# Patient Record
Sex: Female | Born: 2000 | Race: White | Hispanic: No | Marital: Single | Smoking: Never smoker
Health system: Southern US, Community
[De-identification: ages and names within clinical notes are randomized; demographics above are authoritative.]

---
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
## 2020-03-27 ENCOUNTER — Emergency Department
Admission: EM | Admit: 2020-03-27 | Discharge: 2020-03-27 | Disposition: A | Discharge status: Home / Self Care | Payer: BC Managed Care – PPO | Attending: Emergency Medicine | Admitting: Emergency Medicine

## 2020-03-27 ENCOUNTER — Other Ambulatory Visit: Payer: Self-pay

## 2020-03-27 DIAGNOSIS — W228XXA Striking against or struck by other objects, initial encounter: Secondary | ICD-10-CM | POA: Diagnosis not present

## 2020-03-27 DIAGNOSIS — S060X0A Concussion without loss of consciousness, initial encounter: Secondary | ICD-10-CM | POA: Insufficient documentation

## 2020-03-27 DIAGNOSIS — S0990XA Unspecified injury of head, initial encounter: Secondary | ICD-10-CM | POA: Diagnosis present

## 2020-03-27 MED ORDER — PROCHLORPERAZINE MALEATE 10 MG PO TABS: 10.0000 mg | ORAL_TABLET | Freq: Four times a day (QID) | ORAL | 0 refills | Status: DC | PRN
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
## 2020-03-27 NOTE — ED Notes (Signed)
Pt verbalizes understanding of d/c instructions and follow up. D/C form printed, signed and sent to HIM to be scanned into the chart

## 2020-03-27 NOTE — ED Notes (Signed)
Spoke with Dr. Dolores Frame regarding patient, no orders at this time.
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
## 2020-04-01 ENCOUNTER — Emergency Department: Payer: BC Managed Care – PPO

## 2020-04-01 ENCOUNTER — Encounter: Payer: Self-pay | Admitting: Emergency Medicine

## 2020-04-01 ENCOUNTER — Emergency Department
Admission: EM | Admit: 2020-04-01 | Discharge: 2020-04-01 | Disposition: A | Discharge status: Home / Self Care | Payer: BC Managed Care – PPO | Attending: Emergency Medicine | Admitting: Emergency Medicine

## 2020-04-01 ENCOUNTER — Other Ambulatory Visit: Payer: Self-pay

## 2020-04-01 DIAGNOSIS — W2209XD Striking against other stationary object, subsequent encounter: Secondary | ICD-10-CM | POA: Insufficient documentation

## 2020-04-01 DIAGNOSIS — S0990XD Unspecified injury of head, subsequent encounter: Secondary | ICD-10-CM | POA: Diagnosis present

## 2020-04-01 DIAGNOSIS — S0990XA Unspecified injury of head, initial encounter: Secondary | ICD-10-CM

## 2020-04-01 MED ORDER — MECLIZINE HCL 50 MG PO TABS: 50.0000 mg | ORAL_TABLET | Freq: Three times a day (TID) | ORAL | 0 refills | Status: AC | PRN

## 2020-04-01 MED ORDER — KETOROLAC TROMETHAMINE 10 MG PO TABS: 10.0000 mg | ORAL_TABLET | Freq: Four times a day (QID) | ORAL | 0 refills | Status: AC | PRN

## 2020-04-01 MED ORDER — KETOROLAC TROMETHAMINE 30 MG/ML IJ SOLN
30.0000 mg | Freq: Once | INTRAMUSCULAR | Status: AC
  Administered 2020-04-01: 30 mg via INTRAMUSCULAR
  Filled 2020-04-01: qty 1

## 2020-04-01 NOTE — ED Notes (Signed)
Unable to obtain discharge signature as electronic pad in room is not working
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
## 2020-04-01 NOTE — Discharge Instructions (Addendum)
Please keep appointment with neurology on Tuesday. You can continue to take Toradol as needed for headache. You can take meclizine for dizziness. Please return with new or worsening symptoms.
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
## 2021-05-31 ENCOUNTER — Other Ambulatory Visit: Payer: Self-pay | Admitting: Otolaryngology

## 2021-05-31 DIAGNOSIS — J329 Chronic sinusitis, unspecified: Secondary | ICD-10-CM

## 2021-06-07 ENCOUNTER — Other Ambulatory Visit: Payer: BC Managed Care – PPO

## 2021-07-18 ENCOUNTER — Ambulatory Visit
Admission: RE | Admit: 2021-07-18 | Discharge: 2021-07-18 | Disposition: A | Discharge status: Home / Self Care | Payer: BC Managed Care – PPO | Source: Ambulatory Visit | Attending: Otolaryngology | Admitting: Otolaryngology

## 2021-07-18 DIAGNOSIS — J329 Chronic sinusitis, unspecified: Secondary | ICD-10-CM

## 2022-01-05 IMAGING — CT CT HEAD W/O CM
3 series · 15 of 44 positions shown, 18 images · non-contrast
Comparison: None.

CLINICAL DATA: Nausea and headache

EXAM:
CT HEAD WITHOUT CONTRAST
TECHNIQUE: Contiguous axial images were obtained from the base of the skull
through the vertex without intravenous contrast.

[Series 2: head wo · axial · 0.40mm/px · z∈[-90,+20]mm · 9 of 27 slices shown, 12 images]
[im 3/27  brain]
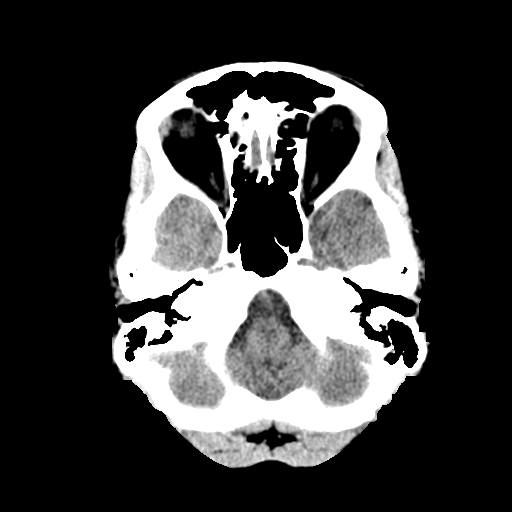
[im 3/27  bone]
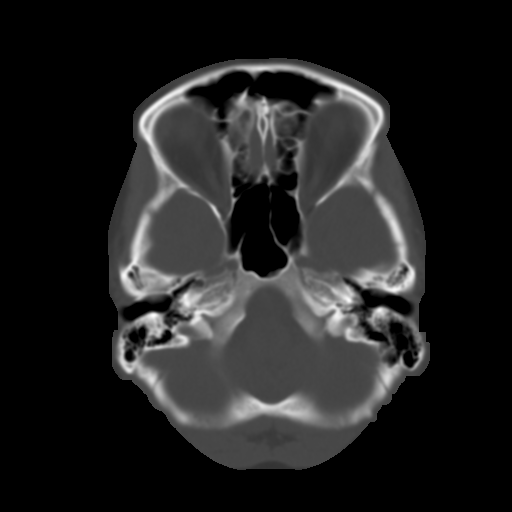
[im 6/27  brain]
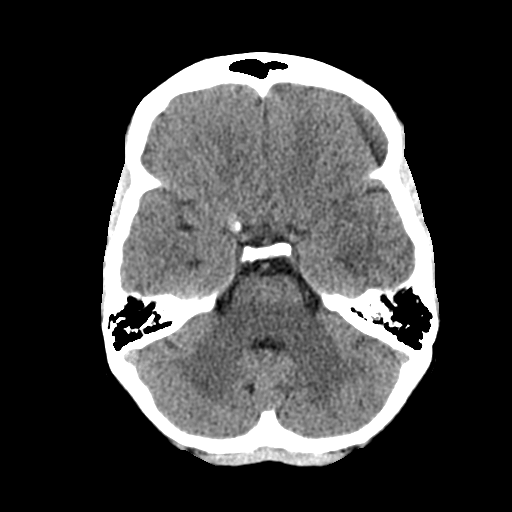
[im 8/27  brain]
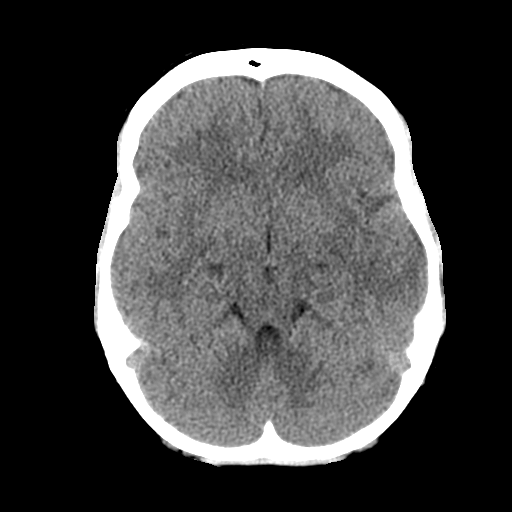
[im 11/27  brain]
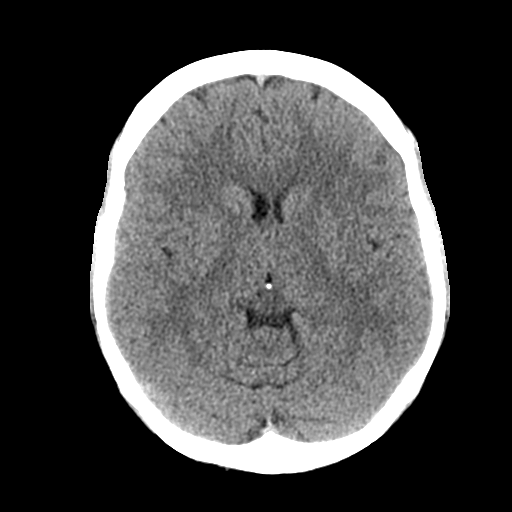
[im 14/27  brain]
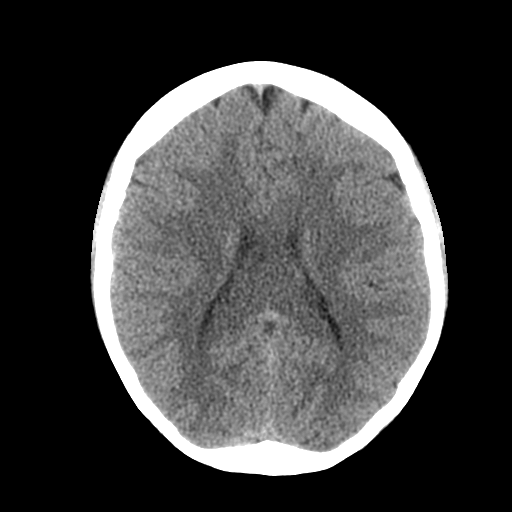
[im 14/27  bone]
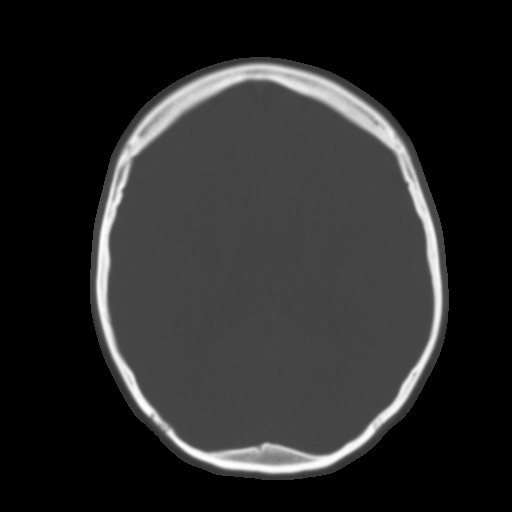
[im 17/27  brain]
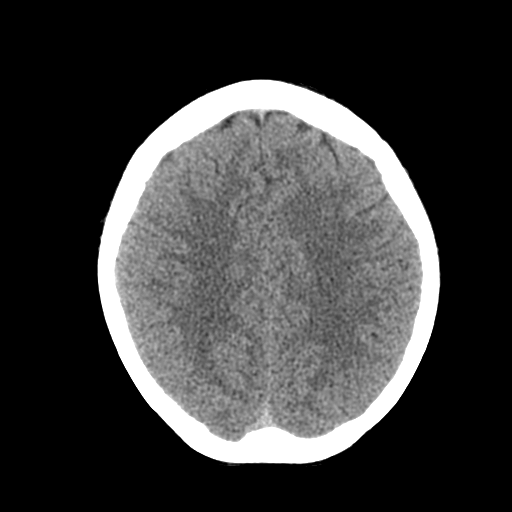
[im 20/27  brain]
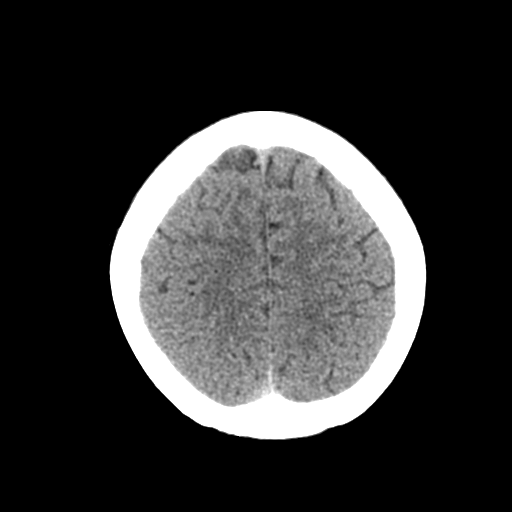
[im 22/27  brain]
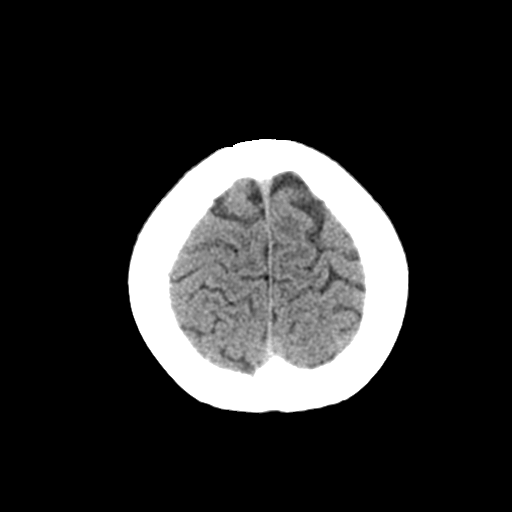
[im 25/27  brain]
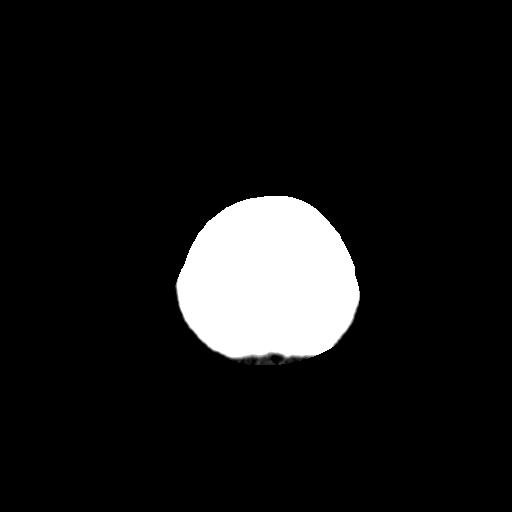
[im 25/27  bone]
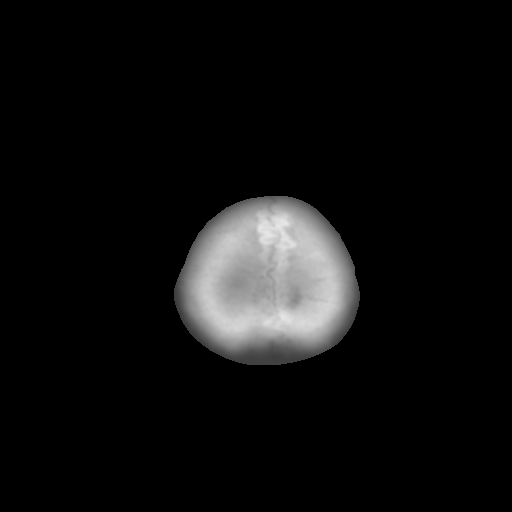

[Series 4: coronal soft tissue · coronal · 0.27mm/px · 3 of 63 slices shown]
[im 21/63  brain]
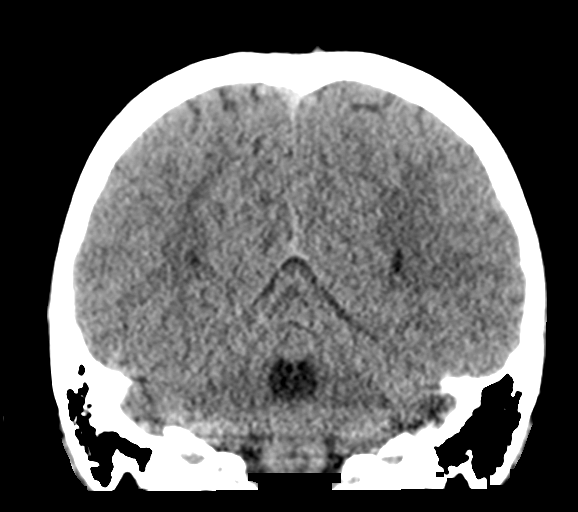
[im 28/63  brain]
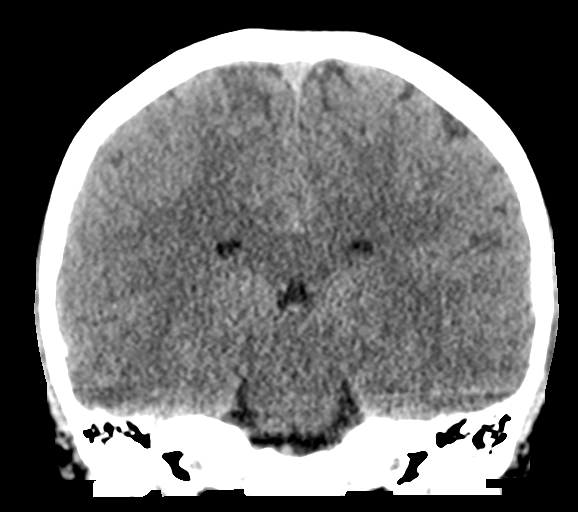
[im 35/63  brain]
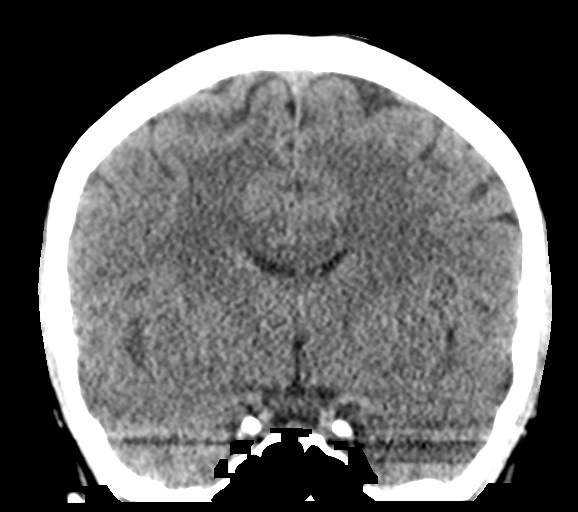

[Series 5: sagittal soft tissue · sagittal · 0.27mm/px · 3 of 53 slices shown]
[im 18/53  brain]
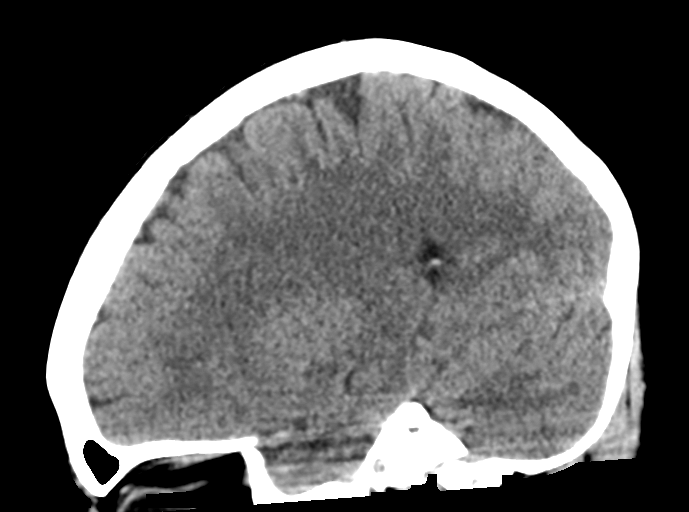
[im 27/53  brain]
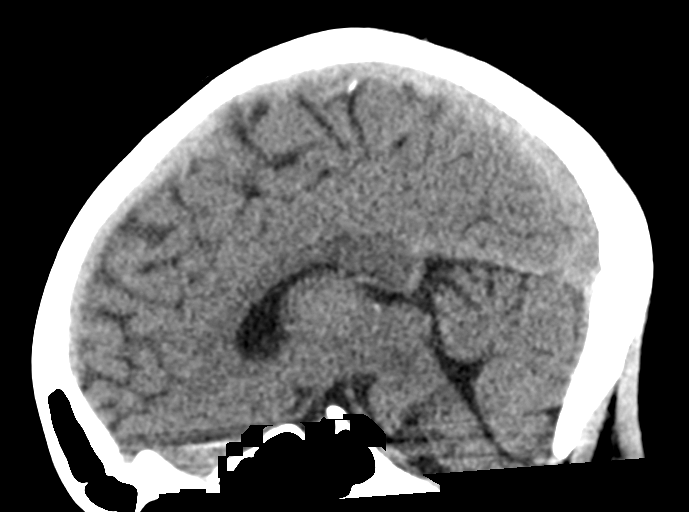
[im 35/53  brain]
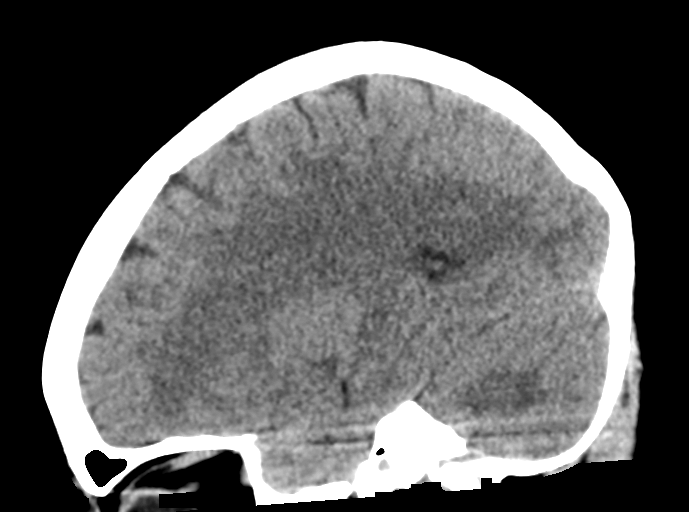

[15 of 44 positions shown; findings below may reference images not displayed]

FINDINGS: Brain: No evidence of acute infarction, hemorrhage, hydrocephalus,
extra-axial collection or mass lesion/mass effect.

Vascular: No hyperdense vessel or unexpected calcification.

Skull: Normal. Negative for fracture or focal lesion.

Sinuses/Orbits: No acute finding.

Other: None
IMPRESSION: Negative non contrasted CT appearance of the brain.

## 2022-02-09 ENCOUNTER — Ambulatory Visit (INDEPENDENT_AMBULATORY_CARE_PROVIDER_SITE_OTHER): Payer: BC Managed Care – PPO | Admitting: Family Medicine

## 2022-02-09 ENCOUNTER — Other Ambulatory Visit: Payer: Self-pay

## 2022-02-09 ENCOUNTER — Encounter: Payer: Self-pay | Admitting: Family Medicine

## 2022-02-09 VITALS — BP 100/68 | HR 78 | Temp 97.9°F | Ht 61.42 in | Wt 125.0 lb

## 2022-02-09 DIAGNOSIS — B3731 Acute candidiasis of vulva and vagina: Secondary | ICD-10-CM

## 2022-02-09 DIAGNOSIS — R197 Diarrhea, unspecified: Secondary | ICD-10-CM

## 2022-02-09 MED ORDER — FLUCONAZOLE 150 MG PO TABS: 150.0000 mg | ORAL_TABLET | ORAL | 5 refills | Status: DC

## 2022-02-09 NOTE — Progress Notes (Signed)
[facility] [address] [place], [place] [address] Phone: [phone] Fax: [phone]   Office Visit Note  Patient Name: Sherri Hansen  Date of [id number]  Med Rec number [id number]  Date of Service: 02/09/2022  Patient has no known allergies.  No chief complaint on file.    Has a history of yeast infections - now has vaginal swelling, itching, clumpy white discharge  Reacted to toilet wipes  Has also had diarrhea since last night - just returned from semester in [place] then was home in [place] and back to campus yesterday - ate fast food and fried food yesterday. No nausea, no blood in stool - diarrhea watery and woke her twice last night Has not had anything to eat today so far      Current Medication:  Outpatient Encounter Medications as of 02/09/2022  Medication Sig   levonorgestrel (KYLEENA) 19.5 MG IUD by Intrauterine route.   prochlorperazine (COMPAZINE) 10 MG tablet Take 1 tablet (10 mg total) by mouth every 6 (six) hours as needed for nausea or vomiting.   No facility-administered encounter medications on file as of 02/09/2022.      Medical History: No past medical history on file.   Vital Signs: There were no vitals taken for this visit.   Review of Systems  Constitutional:  Negative for activity change and fever.  Gastrointestinal:  Positive for diarrhea. Negative for abdominal pain, blood in stool, nausea and vomiting.  Genitourinary:  Positive for vaginal discharge.  Psychiatric/Behavioral: Negative.      Physical Exam Vitals reviewed.  Constitutional:      Appearance: Normal appearance.  Abdominal:     General: Abdomen is flat. Bowel sounds are increased.     Palpations: Abdomen is soft. There is no hepatomegaly or splenomegaly.     Tenderness: There is no abdominal tenderness.  Genitourinary:    Comments: Exam deferred as history consistent with yeast infection Neurological:     Mental Status: She is alert.     Assessment/Plan:  1. Vaginitis due to Candida Has a tendency to get yeast infections easily so will provide refills - fluconazole (DIFLUCAN) 150 MG tablet; Take 1 tablet (150 mg total) by mouth once a week.  Dispense: 2 tablet; Refill: 5  2. Diarrhea, unspecified type Suspect due to change of water and diet yesterday      General Counseling: Sherri Hansen verbalizes understanding of the findings of todays visit and agrees with plan of treatment. I have discussed any further diagnostic evaluation that may be needed or ordered today. We also reviewed her medications today. she has been encouraged to call the office with any questions or concerns that should arise related to todays visit.   No orders of the defined types were placed in this encounter.   No orders of the defined types were placed in this encounter.    Dr Durwin Reges Rad Gramling [facility] University Physician

## 2022-04-04 ENCOUNTER — Encounter: Payer: Self-pay | Admitting: Family Medicine

## 2022-04-04 ENCOUNTER — Ambulatory Visit (INDEPENDENT_AMBULATORY_CARE_PROVIDER_SITE_OTHER): Payer: BC Managed Care – PPO | Admitting: Family Medicine

## 2022-04-04 VITALS — BP 110/64 | HR 80 | Temp 97.1°F | Resp 18 | Ht 61.0 in | Wt 125.0 lb

## 2022-04-04 DIAGNOSIS — N760 Acute vaginitis: Secondary | ICD-10-CM | POA: Diagnosis not present

## 2022-04-04 DIAGNOSIS — J029 Acute pharyngitis, unspecified: Secondary | ICD-10-CM

## 2022-04-04 DIAGNOSIS — R5381 Other malaise: Secondary | ICD-10-CM

## 2022-04-04 DIAGNOSIS — R5383 Other fatigue: Secondary | ICD-10-CM

## 2022-04-04 LAB — POC SOFIA 2 FLU + SARS ANTIGEN FIA
Influenza A, POC: NEGATIVE
Influenza B, POC: NEGATIVE
SARS Coronavirus 2 Ag: NEGATIVE

## 2022-04-04 LAB — POCT WET PREP (WET MOUNT)
Clue Cells Wet Prep Whiff POC: NEGATIVE
Trichomonas Wet Prep HPF POC: ABSENT
pH: >4.5

## 2022-04-04 MED ORDER — METRONIDAZOLE 500 MG PO TABS: 500.0000 mg | ORAL_TABLET | Freq: Two times a day (BID) | ORAL | 0 refills | Status: AC

## 2022-04-04 NOTE — Progress Notes (Signed)
[address] [place], [place] [address] Phone: [phone] Fax: [phone]   Office Visit Note  Patient Name: Sherri Hansen  Date of [id number]  Med Rec number [id number]  Date of Service: 04/04/2022  Patient has no known allergies.  Chief Complaint  Patient presents with   Vaginitis     Had yeast infection in December For 2 days now increased discharge and itching - discharge watery, slight odor  Would also like STI testing since feels things never really went back to normal after last visit  [place] feeling tired on Saturday and was still very fatigued on Monday - now sore throat and sweats and chills Has had mono in the past      Current Medication:  Outpatient Encounter Medications as of 04/04/2022  Medication Sig   escitalopram (LEXAPRO) 10 MG tablet Take 10 mg by mouth daily.   fluconazole (DIFLUCAN) 150 MG tablet Take 1 tablet (150 mg total) by mouth once a week.   levonorgestrel (KYLEENA) 19.5 MG IUD by Intrauterine route.   No facility-administered encounter medications on file as of 04/04/2022.      Medical History: History reviewed. No pertinent past medical history.   Vital Signs: BP 110/64   Pulse 80   Temp (!) 97.1 F (36.2 C) (Tympanic)   Resp 18   Ht 5\' 1"  (1.549 m)   Wt 125 lb (56.7 kg)   SpO2 99%   BMI 23.62 kg/m    Review of Systems  Constitutional:  Positive for diaphoresis and fatigue. Negative for fever.  HENT:  Positive for sore throat.   Genitourinary:  Positive for vaginal discharge.    Physical Exam Vitals reviewed.  Constitutional:      Appearance: Normal appearance.  HENT:     Right Ear: Tympanic membrane normal.     Left Ear: Tympanic membrane normal.     Nose: Congestion and rhinorrhea present.     Mouth/Throat:     Pharynx: No oropharyngeal exudate or posterior oropharyngeal erythema.  Eyes:     Conjunctiva/sclera: Conjunctivae normal.  Pulmonary:     Effort: Pulmonary effort is  normal.     Breath sounds: Normal breath sounds.  Genitourinary:    Exam position: Knee-chest position.     Comments: Labia erythematous and swollen with white watery discharge No vesicles or ulcers Lymphadenopathy:     Cervical: No cervical adenopathy.  Neurological:     Mental Status: She is alert.      Assessment/Plan  1. Acute vaginitis  - Ct, Ng, Mycoplasmas NAA, Urine - POCT Wet Prep ([place]) - metroNIDAZOLE (FLAGYL) 500 MG tablet; Take 1 tablet (500 mg total) by mouth 2 (two) times daily for 7 days.  Dispense: 14 tablet; Refill: 0  Results for orders placed or performed in visit on 04/04/22 (from the past 24 hour(s))  POCT Wet Prep [facility] [place])     Status: Abnormal   Collection Time: 04/04/22  3:47 PM  Result Value Ref Range   Source Wet Prep POC     WBC, Wet Prep HPF POC     Bacteria Wet Prep HPF POC Few Few   BACTERIA WET PREP MORPHOLOGY POC     Clue Cells Wet Prep HPF POC Few (A) None   Clue Cells Wet Prep Whiff POC Negative Whiff    Yeast Wet Prep HPF POC None None   KOH Wet Prep POC None None   Trichomonas Wet Prep HPF POC Absent Absent   pH >4.5  2. Pharyngitis, unspecified etiology  - POC SOFIA 2 FLU + SARS ANTIGEN FIA   Negative flu and covid results  3. Malaise and fatigue If no improvement in a week advise doing blood work to evaluate       General Counseling: Taheerah verbalizes understanding of the findings of todays visit and agrees with plan of treatment. I have discussed any further diagnostic evaluation that may be needed or ordered today. We also reviewed her medications today. she has been encouraged to call the office with any questions or concerns that should arise related to todays visit.   No orders of the defined types were placed in this encounter.   No orders of the defined types were placed in this encounter.    Dr Evette Doffing Zanden Colver [facility] University Physician

## 2022-04-08 LAB — CT, NG, MYCOPLASMAS NAA, URINE
Chlamydia trachomatis, NAA: NEGATIVE
Mycoplasma genitalium NAA: NEGATIVE
Mycoplasma hominis NAA: NEGATIVE
Neisseria gonorrhoeae, NAA: NEGATIVE
Ureaplasma spp NAA: POSITIVE — AB

## 2022-04-09 ENCOUNTER — Other Ambulatory Visit: Payer: Self-pay | Admitting: Family Medicine

## 2022-04-09 ENCOUNTER — Encounter: Payer: Self-pay | Admitting: Family Medicine

## 2022-04-09 MED ORDER — DOXYCYCLINE HYCLATE 100 MG PO TABS: 100.0000 mg | ORAL_TABLET | Freq: Two times a day (BID) | ORAL | 0 refills | Status: AC

## 2022-04-15 ENCOUNTER — Other Ambulatory Visit: Payer: Self-pay | Admitting: Family Medicine

## 2022-04-15 DIAGNOSIS — B3731 Acute candidiasis of vulva and vagina: Secondary | ICD-10-CM

## 2022-04-15 MED ORDER — FLUCONAZOLE 150 MG PO TABS: 150.0000 mg | ORAL_TABLET | ORAL | 0 refills | Status: DC

## 2022-04-27 ENCOUNTER — Ambulatory Visit (INDEPENDENT_AMBULATORY_CARE_PROVIDER_SITE_OTHER): Payer: BC Managed Care – PPO | Admitting: Oncology

## 2022-04-27 ENCOUNTER — Encounter: Payer: Self-pay | Admitting: Oncology

## 2022-04-27 VITALS — HR 89 | Temp 97.9°F

## 2022-04-27 DIAGNOSIS — R102 Pelvic and perineal pain: Secondary | ICD-10-CM

## 2022-04-27 NOTE — Progress Notes (Signed)
[address]. [place], [place] [address] Phone: [phone] Fax: [phone]   Office Visit Note  Patient Name: Sherri Hansen  Date of [id number]  Med Rec number [id number]  Date of Service: 04/27/2022  Patient has no known allergies.  No chief complaint on file.  Patient is an 22 y.o. student here for complaints of pelvic cramping that started Monday. No bleeding. Has an IUD that was placed 3 years ago. Had ovarian cyst with similar symptoms but this pain actually feels worse. Pain lasts for about 10-15 minutes and happens throughout the day. Feels like episodes are worsening.   Has had IUD strings checked in the past but not recently. Has not been able to feel them herself.   Current Medication:  Outpatient Encounter Medications as of 04/27/2022  Medication Sig   escitalopram (LEXAPRO) 10 MG tablet Take 10 mg by mouth daily.   fluconazole (DIFLUCAN) 150 MG tablet Take 1 tablet (150 mg total) by mouth once a week.   levonorgestrel (KYLEENA) 19.5 MG IUD by Intrauterine route.   No facility-administered encounter medications on file as of 04/27/2022.    Medical History: No past medical history on file.   Vital Signs: Pulse 89   Temp 97.9 F (36.6 C) (Tympanic)   SpO2 99%   ROS: As per HPI.  All other pertinent ROS negative.     Review of Systems  Genitourinary:  Positive for pelvic pain. Negative for vaginal discharge.    Physical Exam Vitals reviewed.  Constitutional:      Appearance: Normal appearance.  Genitourinary:    Vagina: Vaginal discharge present.     Cervix: Normal.     Comments: IUD strings visualized. IUD in place.  Neurological:     Mental Status: She is alert.     No results found for this or any previous visit (from the past 24 hour(s)).  Assessment/Plan: 1. Pelvic pain -IUD visualized and in place.  Exam normal. -Has history of ovarian cyst that went away on their own and did not require surgery. Symptoms feel  similar but slightly worse.  Recommend ultrasound to rule out ovarian cyst versus other. -Recommend prophylactically taking ibuprofen 3-4 tabs every 8 hours for pain.  -Order placed and faxed over. Appt set for 05/08/22.   - US Transvaginal Non-OB; Future  Disposition- RTC PRN   General Counseling: Reathel verbalizes understanding of the findings of todays visit and agrees with plan of treatment. I have discussed any further diagnostic evaluation that may be needed or ordered today. We also reviewed her medications today. she has been encouraged to call the office with any questions or concerns that should arise related to todays visit.  No orders of the defined types were placed in this encounter.  No orders of the defined types were placed in this encounter.   I spent 20 minutes dedicated to the care of this patient (face-to-face and non-face-to-face) on the date of the encounter to include what is described in the assessment and plan.   Faythe Casa, NP 04/27/2022 1:56 PM

## 2022-05-08 ENCOUNTER — Ambulatory Visit: Payer: BC Managed Care – PPO

## 2022-06-21 ENCOUNTER — Ambulatory Visit: Payer: BC Managed Care – PPO | Admitting: Oncology

## 2022-06-25 ENCOUNTER — Encounter: Payer: Self-pay | Admitting: Family Medicine

## 2022-06-25 ENCOUNTER — Ambulatory Visit (INDEPENDENT_AMBULATORY_CARE_PROVIDER_SITE_OTHER): Payer: BC Managed Care – PPO | Admitting: Family Medicine

## 2022-06-25 VITALS — BP 112/66 | HR 72 | Temp 98.7°F | Wt 120.0 lb

## 2022-06-25 DIAGNOSIS — J302 Other seasonal allergic rhinitis: Secondary | ICD-10-CM | POA: Diagnosis not present

## 2022-06-25 DIAGNOSIS — B3731 Acute candidiasis of vulva and vagina: Secondary | ICD-10-CM

## 2022-06-25 LAB — POCT WET PREP (WET MOUNT)
Clue Cells Wet Prep Whiff POC: NEGATIVE
PH, VAGINAL: 4.5
Trichomonas Wet Prep HPF POC: ABSENT

## 2022-06-25 MED ORDER — FLUCONAZOLE 150 MG PO TABS: 150.0000 mg | ORAL_TABLET | ORAL | 6 refills | Status: AC

## 2022-06-25 NOTE — Progress Notes (Signed)
[facility] [address] [place], [place] [address] Phone: [phone] Fax: [phone]   Office Visit Note  Patient Name: Sherri Hansen  Date of [id number]  Med Rec number [id number]  Date of Service: 06/25/2022  Patient has no known allergies.  Chief Complaint  Patient presents with   Vaginitis     Vaginal itching and watery discharge for two days No odor  Has been very stressed last week  Also has sore throat with low grade fever (99)      Current Medication:  Outpatient Encounter Medications as of 06/25/2022  Medication Sig   levonorgestrel (KYLEENA) 19.5 MG IUD by Intrauterine route.   escitalopram (LEXAPRO) 10 MG tablet Take 10 mg by mouth daily. (Patient not taking: Reported on 06/25/2022)   fluconazole (DIFLUCAN) 150 MG tablet Take 1 tablet (150 mg total) by mouth once a week.   [DISCONTINUED] fluconazole (DIFLUCAN) 150 MG tablet Take 1 tablet (150 mg total) by mouth once a week.   No facility-administered encounter medications on file as of 06/25/2022.      Medical History: No past medical history on file.   Vital Signs: BP 112/66   Pulse 72   Temp 98.7 F (37.1 C) (Tympanic)   Wt 120 lb (54.4 kg)   SpO2 98%   BMI 22.67 kg/m    Review of Systems  Constitutional: Negative.   HENT:  Positive for sore throat.   Genitourinary:  Positive for vaginal discharge.    Physical Exam Vitals reviewed.  Constitutional:      Appearance: Normal appearance.  HENT:     Right Ear: Tympanic membrane normal.     Left Ear: Tympanic membrane normal.     Nose:     Right Turbinates: Pale.     Left Turbinates: Pale.     Mouth/Throat:     Pharynx: Oropharynx is clear. Uvula midline. No pharyngeal swelling, oropharyngeal exudate or posterior oropharyngeal erythema.     Comments: Lear postnasal drainage Pulmonary:     Effort: Pulmonary effort is normal.     Breath sounds: Normal breath sounds.  Genitourinary:    General: Normal vulva.      Vagina: Vaginal discharge present.  Lymphadenopathy:     Cervical: No cervical adenopathy.  Neurological:     Mental Status: She is alert.      Assessment/Plan: 1. Vaginitis due to Candida  - fluconazole (DIFLUCAN) 150 MG tablet; Take 1 tablet (150 mg total) by mouth once a week.  Dispense: 2 tablet; Refill: 6 - POCT Wet Prep [facility])  2. Seasonal allergies Start fluticasone nasal spray, 2 sprays to each nostril 2 times a day       Results for orders placed or performed in visit on 06/25/22 (from the past 24 hour(s))  POCT Wet Prep [facility] [place])     Status: Abnormal   Collection Time: 06/25/22  5:22 PM  Result Value Ref Range   Source Wet Prep POC     WBC, Wet Prep HPF POC     Bacteria Wet Prep HPF POC Few Few   BACTERIA WET PREP MORPHOLOGY POC     Clue Cells Wet Prep HPF POC None None   Clue Cells Wet Prep Whiff POC Negative Whiff    Yeast Wet Prep HPF POC Moderate (A) None   KOH Wet Prep POC     Trichomonas Wet Prep HPF POC Absent Absent   PH, VAGINAL 4.5      General Counseling: Merlene verbalizes understanding of  the findings of todays visit and agrees with plan of treatment. I have discussed any further diagnostic evaluation that may be needed or ordered today. We also reviewed her medications today. she has been encouraged to call the office with any questions or concerns that should arise related to todays visit.   No orders of the defined types were placed in this encounter.   Meds ordered this encounter  Medications   fluconazole (DIFLUCAN) 150 MG tablet    Sig: Take 1 tablet (150 mg total) by mouth once a week.    Dispense:  2 tablet    Refill:  6     Dr Durwin Reges Elvie Maines AB[facility] University Physician

## 2022-06-28 ENCOUNTER — Ambulatory Visit (INDEPENDENT_AMBULATORY_CARE_PROVIDER_SITE_OTHER): Payer: BC Managed Care – PPO | Admitting: Oncology

## 2022-06-28 ENCOUNTER — Encounter: Payer: Self-pay | Admitting: Oncology

## 2022-06-28 VITALS — HR 101 | Temp 97.7°F

## 2022-06-28 DIAGNOSIS — R21 Rash and other nonspecific skin eruption: Secondary | ICD-10-CM | POA: Diagnosis not present

## 2022-06-28 MED ORDER — CLOTRIMAZOLE-BETAMETHASONE 1-0.05 % EX CREA: 1.0000 | TOPICAL_CREAM | Freq: Every day | CUTANEOUS | 0 refills | Status: AC

## 2022-06-28 NOTE — Progress Notes (Signed)
[facility] [address] [place], [place] [address] Phone: [phone] Fax: [phone]  Office Visit Note  Patient Name: Sherri Hansen  Date of [id number]  Med Rec number [id number]  Date of Service: 06/28/2022  Patient has no known allergies.  Chief Complaint  Patient presents with   Rash    Neck area   Patient is an 22 y.o. student here for complaints of a rash on neck X 4 days. Non itchy. Thinks it may be developing into a circle and she is concerned for ring worm. Yesterday she put lotion on it and otc anitfungal cream and covered it with a bandaid which slightly improved appearance and looks less circular this morning. No contact with anyone who has ringworm. Was around a cat this weekend. Has had ringworm in the past and described it as more itchy than what she is experiencing now.  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  Current Medication:  Outpatient Encounter Medications as of 06/28/2022  Medication Sig   levonorgestrel (KYLEENA) 19.5 MG IUD by Intrauterine route.   escitalopram (LEXAPRO) 10 MG tablet Take 10 mg by mouth daily. (Patient not taking: Reported on 06/25/2022)   fluconazole (DIFLUCAN) 150 MG tablet Take 1 tablet (150 mg total) by mouth once a week.   No facility-administered encounter medications on file as of 06/28/2022.    Medical History: No past medical history on file.  Vital Signs: Pulse (!) 101   Temp 97.7 F (36.5 C) (Tympanic)   SpO2 99%   ROS: As per HPI.  All other pertinent ROS negative.     Review of Systems  Skin:  Positive for rash (on neck).    Physical Exam Skin:    Findings: Rash present. Rash is macular and papular.     Comments: Circular rash the size of a penny on anterior neck to left of jugular. Scaly and red with raised area faintly forming a circle on outer edge.    Small area to right concerning for dermatitis likely from adhesive/bandaid.      No  results found for this or any previous visit (from the past 24 hour(s)).  Assessment/Plan: 1. Rash and nonspecific skin eruption -Discussed differentials including dry skin, allergic dermatitis versus fungal infection including ringworm.  Given, antifungal cream appears to have improved her rash slightly from yesterday, would recommend continuing twice a day for the next 2 to 3 days. -Can try Lotrisone (clotrimazole-betamethasone) cream BID for the next 2 to 3 days.  Prescription sent to pharmacy.  - clotrimazole-betamethasone (LOTRISONE) cream; Apply 1 Application topically daily.  Dispense: 30 g; Refill: 0   Disposition-will let me know if your symptoms do not improve or worsen over the next 3 to 4 days.  General Counseling: Sherri Hansen verbalizes understanding of the findings of todays visit and agrees with plan of treatment. I have discussed any further diagnostic evaluation that may be needed or ordered today. We also reviewed her medications today. she has been encouraged to call the office with any questions or concerns that should arise related to todays visit.   No orders of the defined types were placed in this encounter.   No orders of the defined types were placed in this encounter.   I spent 20 minutes dedicated to the care of this patient (face-to-face and non-face-to-face) on the date of the encounter to include what is described in the assessment and plan.   Durenda Hurt,  NP 06/28/2022 10:09 AM

## 2022-07-12 ENCOUNTER — Encounter: Payer: Self-pay | Admitting: Oncology

## 2022-07-12 ENCOUNTER — Ambulatory Visit (INDEPENDENT_AMBULATORY_CARE_PROVIDER_SITE_OTHER): Payer: BC Managed Care – PPO | Admitting: Oncology

## 2022-07-12 VITALS — HR 81 | Temp 98.0°F

## 2022-07-12 DIAGNOSIS — R21 Rash and other nonspecific skin eruption: Secondary | ICD-10-CM | POA: Diagnosis not present

## 2022-07-12 MED ORDER — DOXYCYCLINE HYCLATE 100 MG PO TABS: 100.0000 mg | ORAL_TABLET | Freq: Two times a day (BID) | ORAL | 0 refills | Status: AC

## 2022-07-12 MED ORDER — ZINC OXIDE 13 % EX CREA: TOPICAL_OINTMENT | Freq: Two times a day (BID) | CUTANEOUS | 0 refills | Status: AC
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
